# Patient Record
Sex: Male | Born: 1950 | Race: White | Hispanic: No | Smoking: Current every day smoker
Health system: Southern US, Community
[De-identification: ages and names within clinical notes are randomized; demographics above are authoritative.]

## PROBLEM LIST (undated history)

## (undated) DIAGNOSIS — E079 Disorder of thyroid, unspecified: Secondary | ICD-10-CM

## (undated) DIAGNOSIS — B182 Chronic viral hepatitis C: Secondary | ICD-10-CM

## (undated) DIAGNOSIS — K760 Fatty (change of) liver, not elsewhere classified: Secondary | ICD-10-CM

## (undated) DIAGNOSIS — F1090 Alcohol use, unspecified, uncomplicated: Secondary | ICD-10-CM

## (undated) DIAGNOSIS — I739 Peripheral vascular disease, unspecified: Secondary | ICD-10-CM

## (undated) DIAGNOSIS — K219 Gastro-esophageal reflux disease without esophagitis: Secondary | ICD-10-CM

## (undated) DIAGNOSIS — I10 Essential (primary) hypertension: Secondary | ICD-10-CM

## (undated) DIAGNOSIS — G629 Polyneuropathy, unspecified: Secondary | ICD-10-CM

## (undated) DIAGNOSIS — R7303 Prediabetes: Secondary | ICD-10-CM

---

## 2008-06-24 ENCOUNTER — Emergency Department (HOSPITAL_COMMUNITY): Admission: EM | Admit: 2008-06-24 | Discharge: 2008-06-24 | Payer: Self-pay | Admitting: Emergency Medicine

## 2008-06-27 ENCOUNTER — Emergency Department (HOSPITAL_COMMUNITY): Admission: EM | Admit: 2008-06-27 | Discharge: 2008-06-27 | Payer: Self-pay | Admitting: Emergency Medicine

## 2009-06-03 ENCOUNTER — Inpatient Hospital Stay (HOSPITAL_COMMUNITY): Admission: EM | Admit: 2009-06-03 | Discharge: 2009-06-04 | Payer: Self-pay | Admitting: Emergency Medicine

## 2009-06-14 ENCOUNTER — Ambulatory Visit (HOSPITAL_COMMUNITY): Admission: RE | Admit: 2009-06-14 | Discharge: 2009-06-14 | Payer: Self-pay | Admitting: Otolaryngology

## 2009-06-15 ENCOUNTER — Inpatient Hospital Stay (HOSPITAL_COMMUNITY): Admission: RE | Admit: 2009-06-15 | Discharge: 2009-06-17 | Payer: Self-pay | Admitting: Otolaryngology

## 2010-04-09 LAB — COMPREHENSIVE METABOLIC PANEL
ALT: 40 U/L (ref 0–53)
AST: 34 U/L (ref 0–37)
Albumin: 3.7 g/dL (ref 3.5–5.2)
Albumin: 3.8 g/dL (ref 3.5–5.2)
Alkaline Phosphatase: 51 U/L (ref 39–117)
BUN: 6 mg/dL (ref 6–23)
BUN: 7 mg/dL (ref 6–23)
CO2: 23 mEq/L (ref 19–32)
CO2: 25 mEq/L (ref 19–32)
Calcium: 8.8 mg/dL (ref 8.4–10.5)
Calcium: 9 mg/dL (ref 8.4–10.5)
Chloride: 110 mEq/L (ref 96–112)
GFR calc Af Amer: >60 mL/min (ref >60)
GFR calc non Af Amer: >60 mL/min (ref >60)
Sodium: 135 mEq/L (ref 135–145)
Sodium: 141 mEq/L (ref 135–145)
Total Bilirubin: 0.4 mg/dL (ref 0.3–1.2)
Total Protein: 6.8 g/dL (ref 6.0–8.3)

## 2010-04-09 LAB — CULTURE, ROUTINE-ABSCESS

## 2010-04-09 LAB — CBC
MCHC: 34.5 g/dL (ref 30.0–36.0)
MCHC: 34.6 g/dL (ref 30.0–36.0)
MCV: 94.8 fL (ref 78.0–100.0)
RDW: 13 % (ref 11.5–15.5)
WBC: 9.3 10*3/uL (ref 4.0–10.5)

## 2010-04-09 LAB — ANAEROBIC CULTURE

## 2010-04-09 LAB — AFB CULTURE WITH SMEAR (NOT AT ARMC)

## 2012-04-22 IMAGING — CT CT MAXILLOFACIAL W/ CM
3 series · 16 of 47 positions shown, 19 images · IV contrast (agent unspecified)
Comparison: CT 06/03/2009.

CLINICAL DATA: Mandible fracture and infection.  Surgery 1 week
ago.

CT MAXILLOFACIAL WITH CONTRAST
TECHNIQUE: Multidetector CT imaging of the maxillofacial
structures was performed with intravenous contrast. Multiplanar CT
image reconstructions were also generated.
Contrast: 100 ml Tmnipaque-QAA IV.

[Series 3: orbit 2.0 h32s · axial · 0.33mm/px · z∈[+990,+1140]mm · 10 of 89 slices shown, 13 images]
[im 7/89  brain]
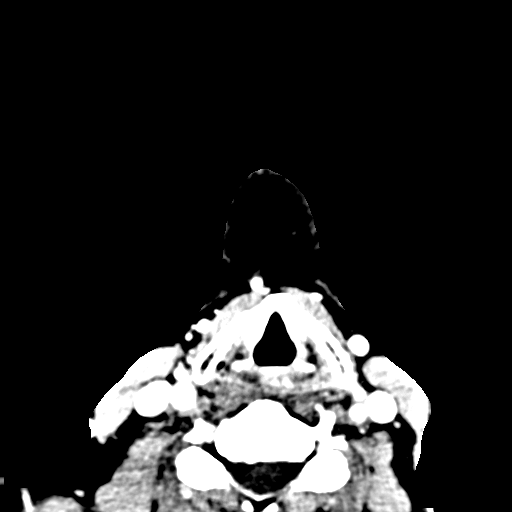
[im 7/89  bone]
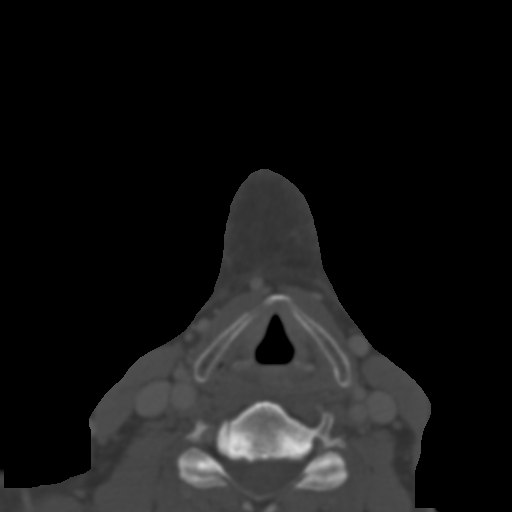
[im 16/89  bone]
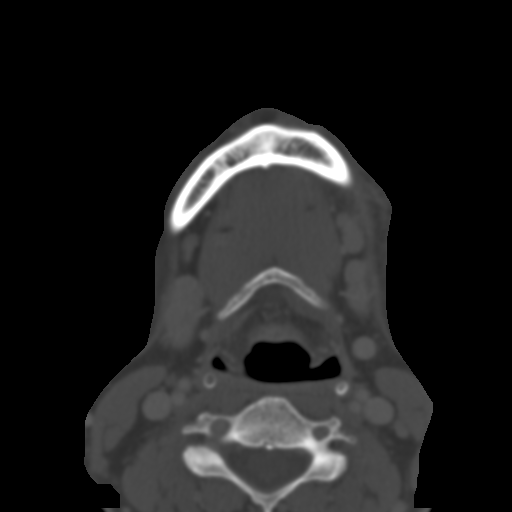
[im 25/89  bone]
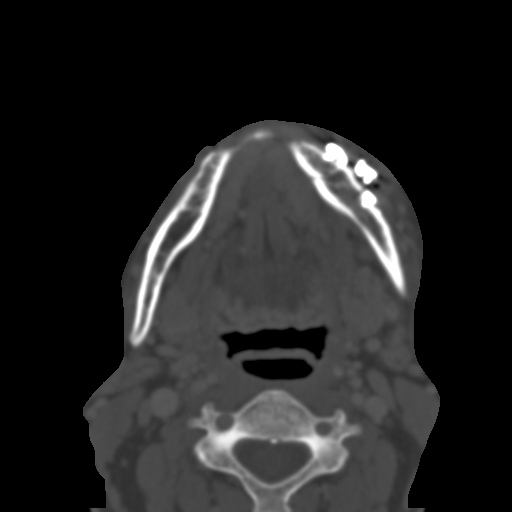
[im 31/89  bone]
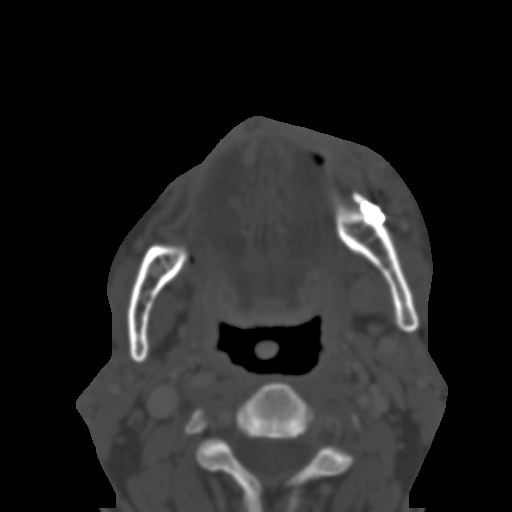
[im 40/89  brain]
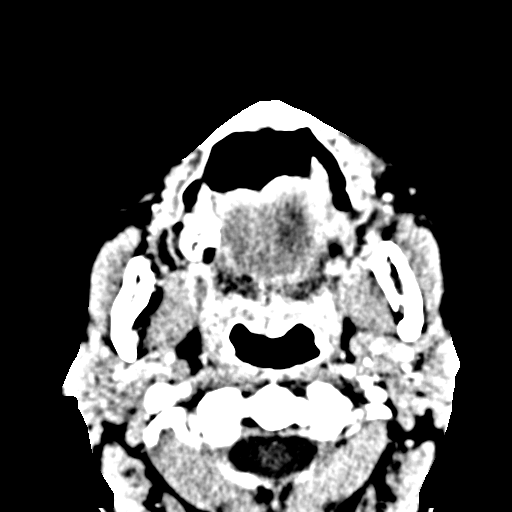
[im 40/89  bone]
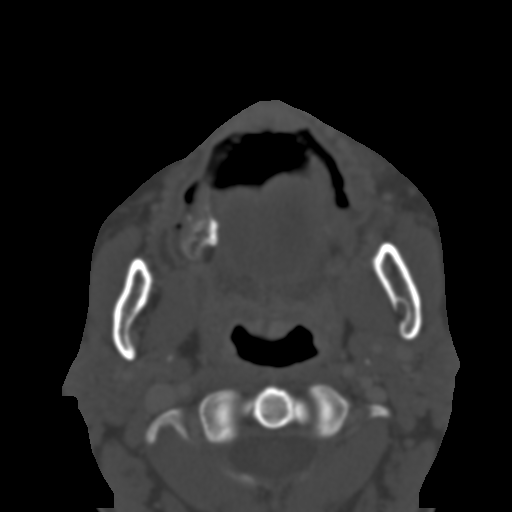
[im 49/89  bone]
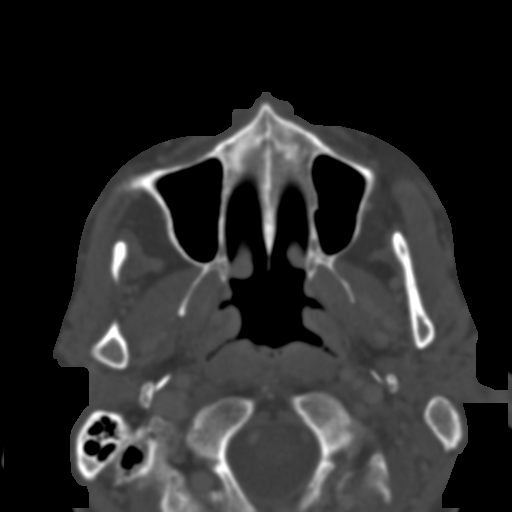
[im 58/89  bone]
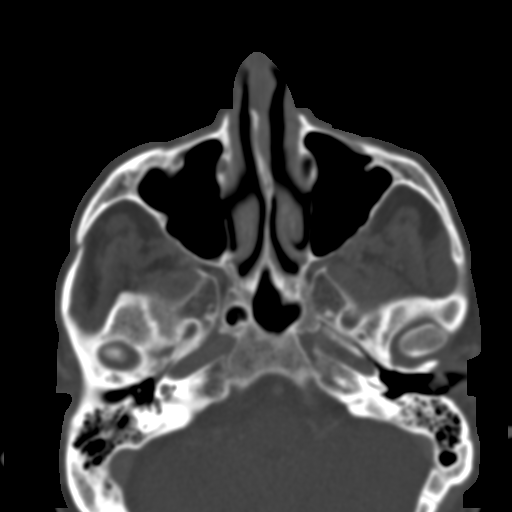
[im 67/89  bone]
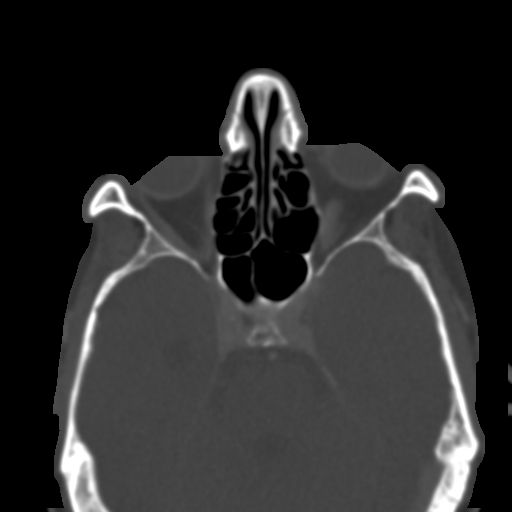
[im 73/89  brain]
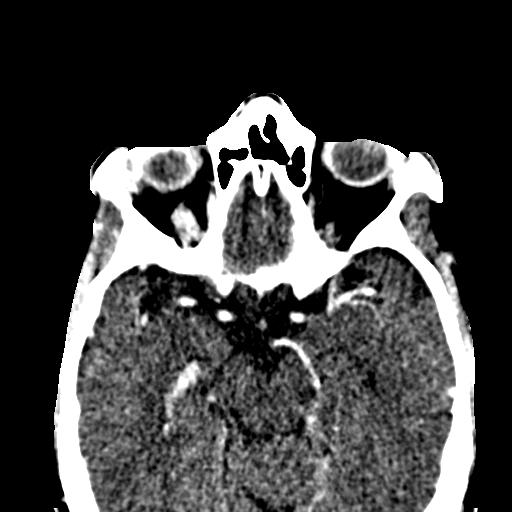
[im 73/89  bone]
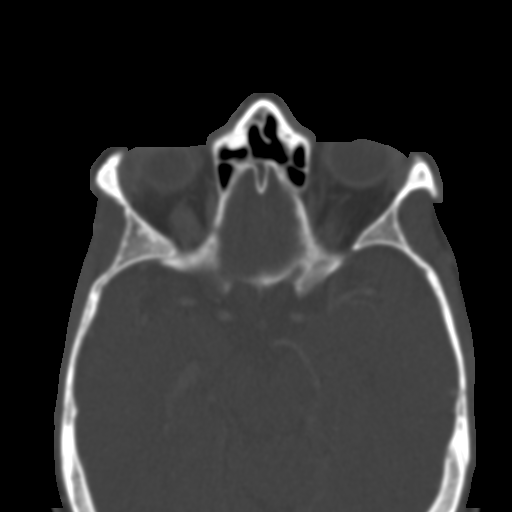
[im 82/89  bone]
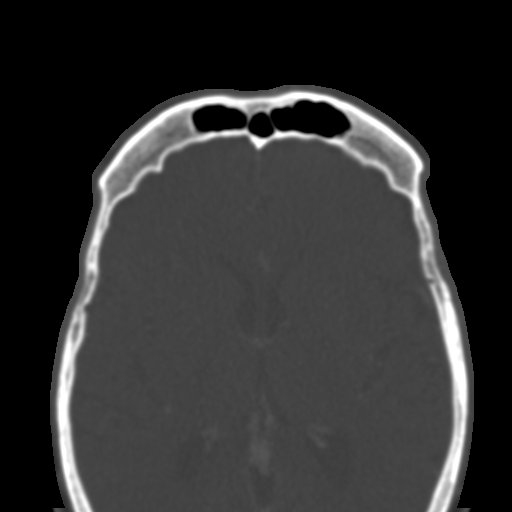

[Series 604: coronal st · coronal · 0.35mm/px · 3 of 70 slices shown]
[im 24/70  bone]
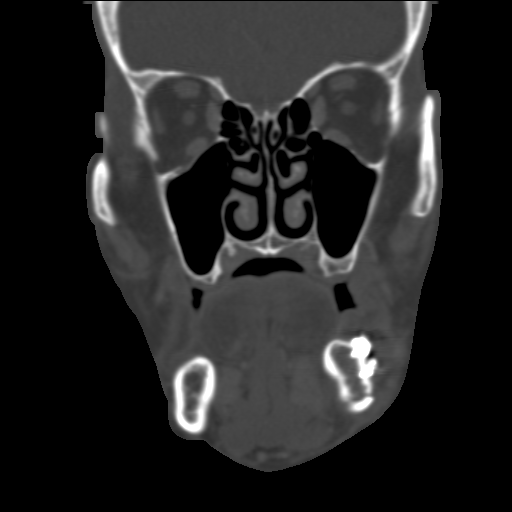
[im 31/70  bone]
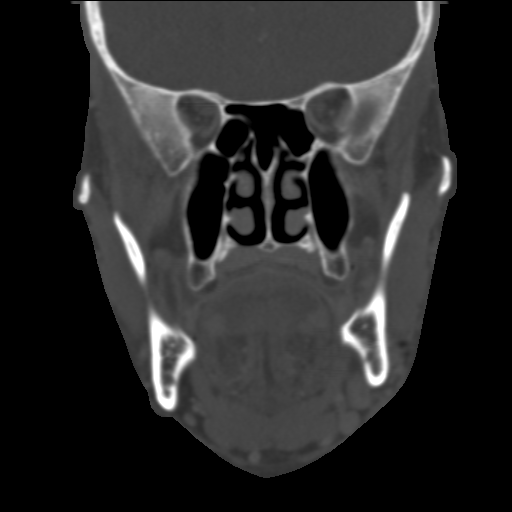
[im 39/70  bone]
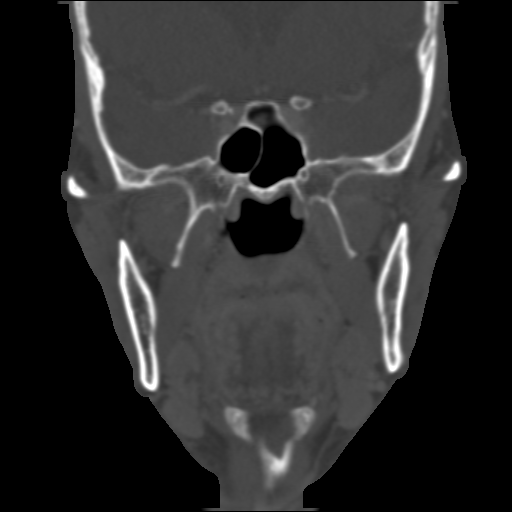

[Series 605: sagittal st · sagittal · 0.35mm/px · 3 of 85 slices shown]
[im 29/85  bone]
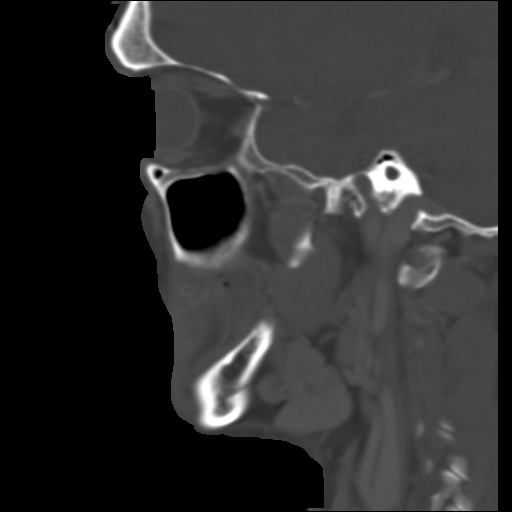
[im 43/85  bone]
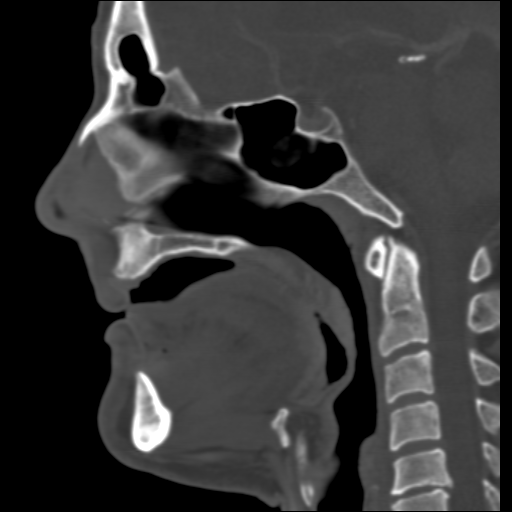
[im 57/85  bone]
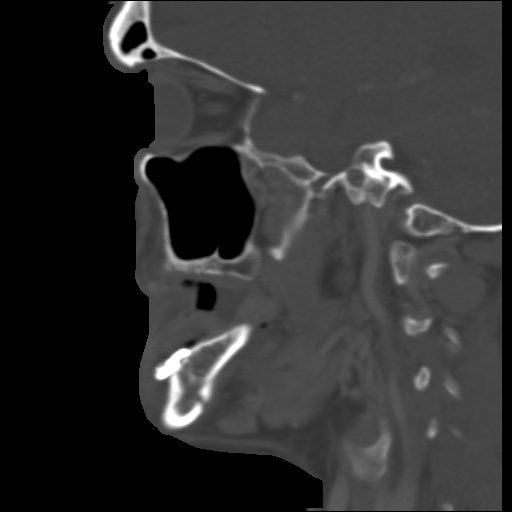

[16 of 47 positions shown; findings below may reference images not displayed]

FINDINGS: There is a fracture of the body of the mandible on the
left.  This is been fixed with two plates and multiple screws.  One
of the upper screws is in the soft tissues.  One of the lower
screws is also in the soft tissues and not within the mandible.
There is soft tissue swelling.  No abscess is present.

There is ill-defined lucency in the left mandible at the level the
fracture.  This was present on the prior study and may be related
to the fracture or possibly infection.  No cortical destruction is
present.

There is a nasal bone fracture, unchanged.  Chronic fracture of the
right zygomatic arch is unchanged.  The sinuses are clear.
IMPRESSION: Fracture of the body of the mandible on the left has been fixed
with two plates and screws.  Two of the screws have backed out of
the mandible.  Fracture alignment is satisfactory.

Hypodensities are present in the left mandible around the fracture.
This was present on the prior study and is likely related to the
fracture.  Alignment is satisfactory.  Underlying infection not
excluded however there is no cortical destruction.

## 2021-01-30 ENCOUNTER — Other Ambulatory Visit: Payer: Self-pay

## 2021-01-30 DIAGNOSIS — I739 Peripheral vascular disease, unspecified: Secondary | ICD-10-CM

## 2021-02-14 NOTE — Progress Notes (Deleted)
Office Note     CC:  RLE claudication  Requesting Provider:  Sharmon Revere, MD  HPI: Henry Wright is a 71 y.o. (04/11/1950) male presenting at the request of .No primary care provider on file. ***  The pt is *** on a statin for cholesterol management.  The pt is *** on a daily aspirin.   Other AC:  *** The pt is *** on medication for hypertension.   The pt is *** diabetic.  Tobacco hx:  ***  No past medical history on file.  *** The histories are not reviewed yet. Please review them in the "History" navigator section and refresh this SmartLink.  Social History   Socioeconomic History   Marital status: Divorced    Spouse name: Not on file   Number of children: Not on file   Years of education: Not on file   Highest education level: Not on file  Occupational History   Not on file  Tobacco Use   Smoking status: Not on file   Smokeless tobacco: Not on file  Substance and Sexual Activity   Alcohol use: Not on file   Drug use: Not on file   Sexual activity: Not on file  Other Topics Concern   Not on file  Social History Narrative   Not on file   Social Determinants of Health   Financial Resource Strain: Not on file  Food Insecurity: Not on file  Transportation Needs: Not on file  Physical Activity: Not on file  Stress: Not on file  Social Connections: Not on file  Intimate Partner Violence: Not on file   ***No family history on file.  No current outpatient medications on file.   No current facility-administered medications for this visit.    Not on File   REVIEW OF SYSTEMS:  *** [X]  denotes positive finding, [ ]  denotes negative finding Cardiac  Comments:  Chest pain or chest pressure:    Shortness of breath upon exertion:    Short of breath when lying flat:    Irregular heart rhythm:        Vascular    Pain in calf, thigh, or hip brought on by ambulation:    Pain in feet at night that wakes you up from your sleep:     Blood clot in your veins:     Leg swelling:         Pulmonary    Oxygen at home:    Productive cough:     Wheezing:         Neurologic    Sudden weakness in arms or legs:     Sudden numbness in arms or legs:     Sudden onset of difficulty speaking or slurred speech:    Temporary loss of vision in one eye:     Problems with dizziness:         Gastrointestinal    Blood in stool:     Vomited blood:         Genitourinary    Burning when urinating:     Blood in urine:        Psychiatric    Major depression:         Hematologic    Bleeding problems:    Problems with blood clotting too easily:        Skin    Rashes or ulcers:        Constitutional    Fever or chills:      PHYSICAL EXAMINATION:  There were no vitals filed for this visit.  General:  WDWN in NAD; vital signs documented above Gait: Not observed HENT: WNL, normocephalic Pulmonary: normal non-labored breathing , without wheezing Cardiac: {Desc; regular/irreg:14544} HR, bruit*** Abdomen: soft, NT, no masses Skin: {With/Without:20273} rashes Vascular Exam/Pulses:  Right Left  Radial {Exam; arterial pulse strength 0-4:30167} {Exam; arterial pulse strength 0-4:30167}  Ulnar {Exam; arterial pulse strength 0-4:30167} {Exam; arterial pulse strength 0-4:30167}  Femoral {Exam; arterial pulse strength 0-4:30167} {Exam; arterial pulse strength 0-4:30167}  Popliteal {Exam; arterial pulse strength 0-4:30167} {Exam; arterial pulse strength 0-4:30167}  DP {Exam; arterial pulse strength 0-4:30167} {Exam; arterial pulse strength 0-4:30167}  PT {Exam; arterial pulse strength 0-4:30167} {Exam; arterial pulse strength 0-4:30167}   Extremities: {With/Without:20273} ischemic changes, {With/Without:20273} Gangrene , {With/Without:20273} cellulitis; {With/Without:20273} open wounds;  Musculoskeletal: no muscle wasting or atrophy  Neurologic: A&O X 3;  No focal weakness or paresthesias are detected Psychiatric:  The pt has {Desc;  normal/abnormal:11317::"Normal"} affect.   Non-Invasive Vascular Imaging:   ***    ASSESSMENT/PLAN: Henry Wright is a 71 y.o. male presenting with ***   ***   Henry John, MD [facility] [phone]

## 2021-02-16 ENCOUNTER — Inpatient Hospital Stay (HOSPITAL_COMMUNITY): Admission: RE | Admit: 2021-02-16 | Payer: Medicare Other | Source: Ambulatory Visit

## 2021-02-16 ENCOUNTER — Encounter: Payer: Medicare Other | Admitting: Vascular Surgery

## 2021-03-30 ENCOUNTER — Encounter: Payer: Medicare Other | Admitting: Surgery

## 2021-03-30 ENCOUNTER — Inpatient Hospital Stay (HOSPITAL_COMMUNITY): Admission: RE | Admit: 2021-03-30 | Payer: Medicare Other | Source: Ambulatory Visit

## 2021-04-24 ENCOUNTER — Other Ambulatory Visit: Payer: Self-pay

## 2021-04-24 DIAGNOSIS — I739 Peripheral vascular disease, unspecified: Secondary | ICD-10-CM

## 2021-05-08 ENCOUNTER — Encounter (HOSPITAL_COMMUNITY): Payer: Medicare Other

## 2021-05-08 ENCOUNTER — Encounter: Payer: Medicare Other | Admitting: Vascular Surgery

## 2022-05-30 ENCOUNTER — Telehealth: Payer: Self-pay

## 2022-05-30 NOTE — Telephone Encounter (Signed)
LVM for patient to call back. AS, CMA 

## 2022-09-02 NOTE — Telephone Encounter (Signed)
 Patient being referred for treatment of HCV. Please schedule next available new patient visit.

## 2022-09-03 NOTE — Telephone Encounter (Signed)
 Calling to schedule NP appt. No answer, no vmail. Mailed unable to contact letter. Advised referring provider.

## 2022-09-11 ENCOUNTER — Encounter: Payer: Self-pay | Admitting: *Deleted

## 2022-09-27 ENCOUNTER — Other Ambulatory Visit: Payer: Self-pay | Admitting: Family Medicine

## 2022-09-27 DIAGNOSIS — I739 Peripheral vascular disease, unspecified: Secondary | ICD-10-CM

## 2022-09-27 DIAGNOSIS — K7460 Unspecified cirrhosis of liver: Secondary | ICD-10-CM

## 2022-10-10 ENCOUNTER — Ambulatory Visit
Admission: RE | Admit: 2022-10-10 | Discharge: 2022-10-10 | Disposition: A | Discharge status: Home / Self Care | Payer: Medicare Other | Source: Ambulatory Visit | Attending: Family Medicine | Admitting: Family Medicine

## 2022-10-10 ENCOUNTER — Other Ambulatory Visit: Payer: Medicare Other

## 2022-10-10 DIAGNOSIS — K7460 Unspecified cirrhosis of liver: Secondary | ICD-10-CM

## 2022-10-11 ENCOUNTER — Ambulatory Visit
Admission: RE | Admit: 2022-10-11 | Discharge: 2022-10-11 | Disposition: A | Discharge status: Home / Self Care | Payer: Medicare Other | Source: Ambulatory Visit | Attending: Family Medicine | Admitting: Family Medicine

## 2022-10-11 ENCOUNTER — Other Ambulatory Visit: Payer: Self-pay | Admitting: Family Medicine

## 2022-10-11 DIAGNOSIS — I739 Peripheral vascular disease, unspecified: Secondary | ICD-10-CM

## 2022-10-25 ENCOUNTER — Ambulatory Visit
Admission: RE | Admit: 2022-10-25 | Discharge: 2022-10-25 | Disposition: A | Discharge status: Home / Self Care | Payer: Medicare Other | Source: Ambulatory Visit | Attending: Family Medicine | Admitting: Family Medicine

## 2022-11-27 NOTE — Telephone Encounter (Signed)
 Mailed ns letter and policy information. Advised referring provider.

## 2022-12-02 NOTE — Telephone Encounter (Signed)
 Patient calling to reschedule NS appt. New appt 03-31-23 1:30 pm. Mailed NP packet. Advised referring provider.

## 2023-03-17 ENCOUNTER — Encounter (INDEPENDENT_AMBULATORY_CARE_PROVIDER_SITE_OTHER): Payer: Self-pay | Admitting: *Deleted

## 2023-03-31 NOTE — Telephone Encounter (Signed)
 Advised referring provider that patient was no show for 2 NP appts. We will not reschedule.

## 2023-09-03 ENCOUNTER — Encounter (HOSPITAL_COMMUNITY): Payer: Self-pay

## 2023-09-03 ENCOUNTER — Emergency Department (HOSPITAL_COMMUNITY)
Admission: EM | Admit: 2023-09-03 | Discharge: 2023-09-03 | Disposition: A | Discharge status: Home / Self Care | Source: Ambulatory Visit | Attending: Emergency Medicine | Admitting: Emergency Medicine

## 2023-09-03 ENCOUNTER — Other Ambulatory Visit: Payer: Self-pay

## 2023-09-03 DIAGNOSIS — R252 Cramp and spasm: Secondary | ICD-10-CM | POA: Diagnosis not present

## 2023-09-03 DIAGNOSIS — F17200 Nicotine dependence, unspecified, uncomplicated: Secondary | ICD-10-CM | POA: Diagnosis not present

## 2023-09-03 DIAGNOSIS — Z79899 Other long term (current) drug therapy: Secondary | ICD-10-CM | POA: Insufficient documentation

## 2023-09-03 DIAGNOSIS — M79662 Pain in left lower leg: Secondary | ICD-10-CM | POA: Diagnosis present

## 2023-09-03 DIAGNOSIS — I10 Essential (primary) hypertension: Secondary | ICD-10-CM | POA: Insufficient documentation

## 2023-09-03 HISTORY — DX: Peripheral vascular disease, unspecified: I73.9

## 2023-09-03 HISTORY — DX: Alcohol use, unspecified, uncomplicated: F10.90

## 2023-09-03 HISTORY — DX: Fatty (change of) liver, not elsewhere classified: K76.0

## 2023-09-03 HISTORY — DX: Essential (primary) hypertension: I10

## 2023-09-03 HISTORY — DX: Gastro-esophageal reflux disease without esophagitis: K21.9

## 2023-09-03 HISTORY — DX: Prediabetes: R73.03

## 2023-09-03 HISTORY — DX: Polyneuropathy, unspecified: G62.9

## 2023-09-03 HISTORY — DX: Disorder of thyroid, unspecified: E07.9

## 2023-09-03 HISTORY — DX: Chronic viral hepatitis C: B18.2

## 2023-09-03 LAB — BASIC METABOLIC PANEL WITH GFR
Anion gap: 13 (ref 5–15)
BUN: 11 mg/dL (ref 8–23)
CO2: 19 mmol/L — ABNORMAL LOW (ref 22–32)
Calcium: 9.3 mg/dL (ref 8.9–10.3)
Chloride: 96 mmol/L — ABNORMAL LOW (ref 98–111)
Creatinine, Ser: 0.74 mg/dL (ref 0.61–1.24)
GFR, Estimated: >60 mL/min (ref >60)
Glucose, Bld: 101 mg/dL — ABNORMAL HIGH (ref 70–99)
Potassium: 4.4 mmol/L (ref 3.5–5.1)
Sodium: 128 mmol/L — ABNORMAL LOW (ref 135–145)

## 2023-09-03 LAB — CBC WITH DIFFERENTIAL/PLATELET
Abs Immature Granulocytes: 0.04 K/uL (ref 0.00–0.07)
Basophils Absolute: 0.1 K/uL (ref 0.0–0.1)
Basophils Relative: 1 %
Eosinophils Absolute: 0.1 K/uL (ref 0.0–0.5)
Eosinophils Relative: 1 %
HCT: 48.3 % (ref 39.0–52.0)
Hemoglobin: 16.3 g/dL (ref 13.0–17.0)
Immature Granulocytes: 1 %
Lymphocytes Relative: 26 %
Lymphs Abs: 1.7 K/uL (ref 0.7–4.0)
MCH: 30.3 pg (ref 26.0–34.0)
MCHC: 33.7 g/dL (ref 30.0–36.0)
MCV: 89.8 fL (ref 80.0–100.0)
Monocytes Absolute: 0.7 K/uL (ref 0.1–1.0)
Monocytes Relative: 10 %
Neutro Abs: 4.2 K/uL (ref 1.7–7.7)
Neutrophils Relative %: 61 %
Platelets: 248 K/uL (ref 150–400)
RBC: 5.38 MIL/uL (ref 4.22–5.81)
RDW: 14.5 % (ref 11.5–15.5)
WBC: 6.7 K/uL (ref 4.0–10.5)
nRBC: 0 % (ref 0.0–0.2)

## 2023-09-03 MED ORDER — GABAPENTIN 300 MG PO CAPS: 300.0000 mg | ORAL_CAPSULE | Freq: Three times a day (TID) | ORAL | 0 refills | Status: AC

## 2023-09-03 MED ORDER — LOSARTAN POTASSIUM 25 MG PO TABS
100.0000 mg | ORAL_TABLET | Freq: Once | ORAL | Status: AC
  Administered 2023-09-03 (×2): 100 mg via ORAL
  Filled 2023-09-03: qty 4

## 2023-09-03 NOTE — ED Triage Notes (Signed)
 Pt c/o bilateral foot to knee numbness ongoing x 2 years that has gotten worse, rates pain 7/10. Pt is ambulatory. states his PCP has referred him to a vein specialist and a liver specialist, pt states his PCP thinks he has a blood clot in his R leg but pt states he believes he has neuropathy. Pt also endorses hx of hepatitis C. Pt also endorses Hx of HTN and states he does take medication for it but did not take it today, BP in triage 233/101 (139). Denies CP and denies shob

## 2023-09-03 NOTE — Discharge Instructions (Addendum)
 We evaluated you for your leg pain.  Your examination is reassuring.  Your sodium level is slightly low, please try to drink some Gatorade or electrolyte beverages over the next few days.  We suspect your leg pain is most likely due to poor circulation.  We would recommend following up with a vascular surgeon.  You can call Dr. Pearline for follow-up.  Please return if you have any new or worsening symptoms such as color change in your legs, swelling to your legs, lightheadedness or dizziness, severe pain in your legs, or any other new symptoms.

## 2023-09-03 NOTE — ED Provider Notes (Signed)
 [place] EMERGENCY DEPARTMENT AT [facility] Provider Note  CSN: [id number] Arrival date & time: 09/03/23 1035  Chief Complaint(s) Leg Pain  HPI Henry Wright is a 73 y.o. male history of hepatitis C, GERD, hypertension, peripheral artery disease presented to the emergency department with intermittent numbness in legs.  Patient reports that he has been having intermittent numbness, cramping, discomfort in lower legs bilaterally for around 2 years.  Reports that it has been worse over past 3 months and decided to come and get checked out today.  Also did not take his blood pressure medicine today.  Denies any fevers or chills.  Denies any back pain, urinary incontinence, numbness or tingling in the groin.  Denies any leg swelling, recent travel or surgery.  No rash.   Past Medical History Past Medical History:  Diagnosis Date   Alcohol use    Chronic hepatitis C without hepatic coma (HCC)    GERD (gastroesophageal reflux disease)    Hypertension    Metabolic dysfunction-associated steatotic liver disease (MASLD)    PAD (peripheral artery disease) (HCC)    Peripheral neuropathy    Prediabetes    Thyroid disease    There are no active problems to display for this patient.  Home Medication(s) Prior to Admission medications   Medication Sig Start Date End Date Taking? Authorizing Provider  atorvastatin (LIPITOR) 10 MG tablet Take 10 mg by mouth daily. 08/15/23  Yes [provider]  gabapentin  (NEURONTIN ) 300 MG capsule Take 1 capsule (300 mg total) by mouth 3 (three) times daily. 09/03/23  Yes Francesca Elsie CROME, MD  levothyroxine (SYNTHROID) 25 MCG tablet Take 25 mcg by mouth daily. 08/17/23  Yes [provider]  losartan  (COZAAR ) 100 MG tablet Take 100 mg by mouth daily. 08/15/23  Yes [provider]  omeprazole (PRILOSEC) 10 MG capsule Take 10 mg by mouth daily.   Yes [provider]                                                                                                                                     Past Surgical History  Family History No family history on file.  Social History   Allergies Patient has no allergy information on record.  Review of Systems Review of Systems  All other systems reviewed and are negative.   Physical Exam Vital Signs  I have reviewed the triage vital signs BP (!) 187/85   Pulse 65   Temp (!) 97 F (36.1 C) (Axillary)   Resp 16   Ht 5' 7.5 (1.715 m)   Wt 74.8 kg   SpO2 94%   BMI 25.46 kg/m  Physical Exam Vitals and nursing note reviewed.  Constitutional:      General: He is not in acute distress.    Appearance: Normal appearance.  HENT:     Mouth/Throat:     Mouth: Mucous membranes are moist.  Eyes:  Conjunctiva/sclera: Conjunctivae normal.  Cardiovascular:     Rate and Rhythm: Normal rate and regular rhythm.  Pulmonary:     Effort: Pulmonary effort is normal. No respiratory distress.     Breath sounds: Normal breath sounds.  Abdominal:     General: Abdomen is flat.     Palpations: Abdomen is soft.     Tenderness: There is no abdominal tenderness.  Musculoskeletal:     Comments: 1+ bilateral DP pulse, confirmed by bedside ultrasound.  Delayed capillary refill.  Feet warm.  No calf tenderness or edema.  Diminished sensation bilaterally to light touch  Skin:    General: Skin is warm and dry.     Capillary Refill: Capillary refill takes less than 2 seconds.  Neurological:     Mental Status: He is alert and oriented to person, place, and time. Mental status is at baseline.  Psychiatric:        Mood and Affect: Mood normal.        Behavior: Behavior normal.     ED Results and Treatments Labs (all labs ordered are listed, but only abnormal results are displayed) Labs Reviewed  BASIC METABOLIC PANEL WITH GFR - Abnormal; Notable for the following components:      Result Value   Sodium 128 (*)    Chloride 96 (*)    CO2 19 (*)    Glucose, Bld 101  (*)    All other components within normal limits  CBC WITH DIFFERENTIAL/PLATELET                                                                                                                          Radiology No results found.  Pertinent labs & imaging results that were available during my care of the patient were reviewed by me and considered in my medical decision making (see MDM for details).  Medications Ordered in ED Medications  losartan  (COZAAR ) tablet 100 mg (100 mg Oral Given 09/03/23 1128)                                                                                                                                     Procedures Procedures  (including critical care time)  Medical Decision Making / ED Course   MDM:  73 year old presenting to the emergency department with years of leg cramping, numbness, tingling.  Given diminished pulses, delayed capillary refill, history of smoking,  documented history of peripheral artery disease suspect vascular disease as primary cause of patient's symptoms.  Differential also includes peripheral neuropathy from alcohol use or other process.  Considered other cause such as central cause, no other concerning symptoms to suggest spinal process, no other concerning symptoms to suggest brain process and symptoms are bilaterally.  No clinical findings concerning for DVT.  Given the patient does have evidence of intact circulation although diminished, I feel he is stable for discharge and outpatient follow-up.  Notably, patient did not take his pressure medication and his blood pressure was very elevated.  After we gave him his regular blood pressure medicine it improved.  He was asymptomatic from this.      Additional history obtained: -Additional history obtained from spouse -External records from outside source obtained and reviewed including: Chart review including previous notes, labs, imaging, consultation notes including prior notes     Lab Tests: -I ordered, reviewed, and interpreted labs.   The pertinent results include:   Labs Reviewed  BASIC METABOLIC PANEL WITH GFR - Abnormal; Notable for the following components:      Result Value   Sodium 128 (*)    Chloride 96 (*)    CO2 19 (*)    Glucose, Bld 101 (*)    All other components within normal limits  CBC WITH DIFFERENTIAL/PLATELET    Notable for mild hyponatremia, discussed with pt  ing. I agree with the radiologist interpretation   Medicines ordered and prescription drug management: Meds ordered this encounter  Medications   losartan  (COZAAR ) tablet 100 mg   gabapentin  (NEURONTIN ) 300 MG capsule    Sig: Take 1 capsule (300 mg total) by mouth 3 (three) times daily.    Dispense:  21 capsule    Refill:  0    -I have reviewed the patients home medicines and have made adjustments as needed   Social Determinants of Health:  Diagnosis or treatment significantly limited by social determinants of health: current smoker and alcohol use   Reevaluation: After the interventions noted above, I reevaluated the patient and found that their symptoms have improved  Co morbidities that complicate the patient evaluation  Past Medical History:  Diagnosis Date   Alcohol use    Chronic hepatitis C without hepatic coma (HCC)    GERD (gastroesophageal reflux disease)    Hypertension    Metabolic dysfunction-associated steatotic liver disease (MASLD)    PAD (peripheral artery disease) (HCC)    Peripheral neuropathy    Prediabetes    Thyroid disease       Dispostion: Disposition decision including need for hospitalization was considered, and patient discharged from emergency department.    Final Clinical Impression(s) / ED Diagnoses Final diagnoses:  Leg cramping     This chart was dictated using voice recognition software.  Despite best efforts to proofread,  errors can occur which can change the documentation meaning.    Francesca Elsie CROME,  MD 09/03/23 1339

## 2023-09-23 ENCOUNTER — Other Ambulatory Visit (HOSPITAL_COMMUNITY): Payer: Self-pay | Admitting: Emergency Medicine

## 2023-09-23 DIAGNOSIS — I739 Peripheral vascular disease, unspecified: Secondary | ICD-10-CM

## 2023-09-24 ENCOUNTER — Ambulatory Visit (HOSPITAL_COMMUNITY)
Admission: RE | Admit: 2023-09-24 | Discharge: 2023-09-24 | Disposition: A | Discharge status: Home / Self Care | Source: Ambulatory Visit | Attending: Vascular Surgery | Admitting: Vascular Surgery

## 2023-09-24 ENCOUNTER — Encounter: Payer: Self-pay | Admitting: Vascular Surgery

## 2023-09-24 ENCOUNTER — Ambulatory Visit (INDEPENDENT_AMBULATORY_CARE_PROVIDER_SITE_OTHER): Admitting: Vascular Surgery

## 2023-09-24 VITALS — BP 198/107 | HR 68 | Temp 98.4°F | Resp 20 | Ht 67.5 in | Wt 165.0 lb

## 2023-09-24 DIAGNOSIS — I739 Peripheral vascular disease, unspecified: Secondary | ICD-10-CM | POA: Insufficient documentation

## 2023-09-24 DIAGNOSIS — I70223 Atherosclerosis of native arteries of extremities with rest pain, bilateral legs: Secondary | ICD-10-CM | POA: Insufficient documentation

## 2023-09-24 LAB — VAS US ABI WITH/WO TBI: Left ABI: 0.89

## 2023-09-24 NOTE — Progress Notes (Signed)
 Patient ID: Henry Wright, male   DOB: 07-19-1950, 73 y.o.   MRN: [id number]  Reason for Consult: New Patient (Initial Visit)   Referred by Henry Leni Edyth DELENA, MD  Subjective:     HPI:  Henry Wright is a 73 y.o. male with history of hypertension and family history of coronary artery disease.  He does not have coronary artery disease himself.  He has a long history of bilateral lower extremity claudication with short distance walking.  He now has rest pain bilaterally causing him to walk in the middle the night to relieve the pain.  He denies tissue loss or ulceration.  He takes no antiplatelet medications but he is on Lipitor.  He continues to smoke daily.  Past Medical History:  Diagnosis Date   Alcohol use    Chronic hepatitis C without hepatic coma (HCC)    GERD (gastroesophageal reflux disease)    Hypertension    Metabolic dysfunction-associated steatotic liver disease (MASLD)    PAD (peripheral artery disease) (HCC)    Peripheral neuropathy    Prediabetes    Thyroid disease    History reviewed. No pertinent family history. History reviewed. No pertinent surgical history.  Short Social History:  Social History   Tobacco Use   Smoking status: Every Day    Current packs/day: 1.00    Types: Cigarettes   Smokeless tobacco: Never  Substance Use Topics   Alcohol use: Not Currently    No Known Allergies  Current Outpatient Medications  Medication Sig Dispense Refill   atorvastatin (LIPITOR) 10 MG tablet Take 10 mg by mouth daily.     gabapentin  (NEURONTIN ) 300 MG capsule Take 1 capsule (300 mg total) by mouth 3 (three) times daily. 21 capsule 0   levothyroxine (SYNTHROID) 25 MCG tablet Take 25 mcg by mouth daily.     losartan  (COZAAR ) 100 MG tablet Take 100 mg by mouth daily.     omeprazole (PRILOSEC) 10 MG capsule Take 10 mg by mouth daily.     No current facility-administered medications for this visit.    Review of Systems  Constitutional:  Constitutional  negative. HENT: HENT negative.  Eyes: Eyes negative.  Cardiovascular: Positive for claudication.  GI: Gastrointestinal negative.  Musculoskeletal: Positive for leg pain.  Neurological: Neurological negative. Hematologic: Hematologic/lymphatic negative.  Psychiatric: Psychiatric negative.        Objective:  Objective   Vitals:   09/24/23 1350  BP: (!) 198/107  Pulse: 68  Resp: 20  Temp: 98.4 F (36.9 C)  TempSrc: Temporal  SpO2: 93%  Weight: 165 lb (74.8 kg)  Height: 5' 7.5 (1.715 m)   Body mass index is 25.46 kg/m.  Physical Exam HENT:     Head: Normocephalic.     Mouth/Throat:     Mouth: Mucous membranes are moist.  Eyes:     Pupils: Pupils are equal, round, and reactive to light.  Neck:     Vascular: No carotid bruit.  Cardiovascular:     Rate and Rhythm: Normal rate.     Pulses:          Femoral pulses are 3+ on the right side and 2+ on the left side.      Popliteal pulses are 0 on the right side and 0 on the left side.  Pulmonary:     Effort: Pulmonary effort is normal.  Abdominal:     General: Abdomen is flat.     Palpations: Abdomen is soft.  Skin:    Capillary  Refill: Capillary refill takes more than 3 seconds.  Neurological:     General: No focal deficit present.     Mental Status: He is alert.     Data: ABI Findings:  +---------+------------------+-----+----------+--------+  Right   Rt Pressure (mmHg)IndexWaveform  Comment   +---------+------------------+-----+----------+--------+  Brachial 203                                        +---------+------------------+-----+----------+--------+  PTA     254               1.18 monophasic          +---------+------------------+-----+----------+--------+  PERO    254               1.18 monophasic          +---------+------------------+-----+----------+--------+  DP      254               1.18 monophasic          +---------+------------------+-----+----------+--------+   Great Toe23                0.11 Abnormal            +---------+------------------+-----+----------+--------+   +---------+------------------+-----+----------+-------+  Left    Lt Pressure (mmHg)IndexWaveform  Comment  +---------+------------------+-----+----------+-------+  Brachial 216                                       +---------+------------------+-----+----------+-------+  PTA                            absent             +---------+------------------+-----+----------+-------+  PERO    171               0.84 monophasic         +---------+------------------+-----+----------+-------+  DP      192               0.89 monophasic         +---------+------------------+-----+----------+-------+  Great Toe85                0.39 Abnormal           +---------+------------------+-----+----------+-------+   +-------+-----------+-----------+------------+------------+  ABI/TBIToday's ABIToday's TBIPrevious ABIPrevious TBI  +-------+-----------+-----------+------------+------------+  Right N/C        .11                                  +-------+-----------+-----------+------------+------------+  Left  .89        .39                                  +-------+-----------+-----------+------------+------------+        Summary:  Right: Resting right ankle-brachial index indicates noncompressible right  lower extremity arteries. The right toe-brachial index is abnormal.    Left: Resting left ankle-brachial index indicates mild left lower  extremity arterial disease. The left toe-brachial index is abnormal.         Assessment/Plan:     73 year old male with atherosclerosis native arteries with bilateral lower extremity rest pain.  Symptoms are classic for  rest pain and short distance claudication but without tissue loss or ulceration.  He has palpable femoral pulses no pulses below with delayed capillary refill particularly on the  right.  We discussed the need for smoking cessation and addition of aspirin to his medication regimen.  I have recommended continued walking and protection of his feet.  I will have him follow-up in a couple months with bilateral lower extremity duplex for further evaluation and to discuss possible aortogram.  If his symptoms worsen in the interim he can be scheduled for aortogram we will focus on the right lower extremity where the toe pressure is severely reduced.  All questions were answered he demonstrates good understanding in the presence of his sister-in-law.     Penne Lonni Colorado MD [facility]

## 2023-09-25 ENCOUNTER — Other Ambulatory Visit: Payer: Self-pay | Admitting: *Deleted

## 2023-09-25 DIAGNOSIS — I70223 Atherosclerosis of native arteries of extremities with rest pain, bilateral legs: Secondary | ICD-10-CM

## 2023-10-07 ENCOUNTER — Other Ambulatory Visit: Payer: Self-pay | Admitting: Family Medicine

## 2023-10-07 DIAGNOSIS — K7460 Unspecified cirrhosis of liver: Secondary | ICD-10-CM

## 2023-11-21 ENCOUNTER — Other Ambulatory Visit: Payer: Self-pay

## 2023-11-21 DIAGNOSIS — I70223 Atherosclerosis of native arteries of extremities with rest pain, bilateral legs: Secondary | ICD-10-CM

## 2023-12-31 ENCOUNTER — Ambulatory Visit: Admitting: Vascular Surgery

## 2023-12-31 ENCOUNTER — Ambulatory Visit (HOSPITAL_COMMUNITY): Admission: RE | Admit: 2023-12-31 | Discharge: 2023-12-31 | Attending: Surgery | Admitting: Surgery

## 2023-12-31 ENCOUNTER — Encounter: Payer: Self-pay | Admitting: Vascular Surgery

## 2023-12-31 ENCOUNTER — Other Ambulatory Visit: Payer: Self-pay | Admitting: Vascular Surgery

## 2023-12-31 VITALS — BP 146/85 | HR 58 | Temp 97.9°F

## 2023-12-31 DIAGNOSIS — I70223 Atherosclerosis of native arteries of extremities with rest pain, bilateral legs: Secondary | ICD-10-CM

## 2023-12-31 LAB — VAS US ABI WITH/WO TBI
Left ABI: 0.74
Right ABI: 0.64

## 2023-12-31 MED ORDER — NICOTINE 7 MG/24HR TD PT24: 7.0000 mg | MEDICATED_PATCH | Freq: Every day | TRANSDERMAL | Status: AC

## 2023-12-31 NOTE — H&P (View-Only) (Signed)
 Patient ID: Henry Wright, male   DOB: 10/10/1950, 73 y.o.   MRN: [id number]  Reason for Consult: Follow-up   Referred by Maree Leni Edyth DELENA, MD  Subjective:     HPI:  Henry Wright is a 73 y.o. male has history current everyday smoking about 1 pack/day and hypertension.  He has attempted to quit smoking for many years unsuccessfully.  He has right greater than left lower extremity short distance claudication with occasional pain at night in the right lower extremity.  He is taking statin and aspirin.  He denies tissue loss or ulceration.  Past Medical History:  Diagnosis Date   Alcohol use    Chronic hepatitis C without hepatic coma (HCC)    GERD (gastroesophageal reflux disease)    Hypertension    Metabolic dysfunction-associated steatotic liver disease (MASLD)    PAD (peripheral artery disease)    Peripheral neuropathy    Prediabetes    Thyroid disease    History reviewed. No pertinent family history.  History reviewed. No pertinent surgical history.  Short Social History:  Social History   Tobacco Use   Smoking status: Every Day    Current packs/day: 1.00    Average packs/day: 1 pack/day for 62.0 years (62.0 ttl pk-yrs)    Types: Cigarettes    Start date: 12/30/1961   Smokeless tobacco: Never  Substance Use Topics   Alcohol use: Not Currently    No Known Allergies  Current Outpatient Medications  Medication Sig Dispense Refill   atorvastatin (LIPITOR) 10 MG tablet Take 10 mg by mouth daily.     gabapentin  (NEURONTIN ) 300 MG capsule Take 1 capsule (300 mg total) by mouth 3 (three) times daily. 21 capsule 0   levothyroxine (SYNTHROID) 25 MCG tablet Take 25 mcg by mouth daily.     losartan  (COZAAR ) 100 MG tablet Take 100 mg by mouth daily.     omeprazole (PRILOSEC) 10 MG capsule Take 10 mg by mouth daily.     No current facility-administered medications for this visit.    Review of Systems  Constitutional:  Constitutional negative. HENT: HENT negative.   Eyes: Eyes negative.  Respiratory: Respiratory negative.  Cardiovascular: Cardiovascular negative.  GI: Gastrointestinal negative.  Musculoskeletal: Positive for gait problem and leg pain.  Psychiatric: Psychiatric negative.        Objective:  Objective   Vitals:   12/31/23 1516  BP: (!) 146/85  Pulse: (!) 58  Temp: 97.9 F (36.6 C)  SpO2: 94%   There is no height or weight on file to calculate BMI.  Physical Exam HENT:     Head: Normocephalic.  Eyes:     Pupils: Pupils are equal, round, and reactive to light.  Cardiovascular:     Rate and Rhythm: Normal rate.     Pulses:          Femoral pulses are 2+ on the right side and 2+ on the left side.      Popliteal pulses are 0 on the right side and 0 on the left side.  Pulmonary:     Effort: Pulmonary effort is normal.  Abdominal:     General: Abdomen is flat.  Musculoskeletal:        General: Normal range of motion.     Right lower leg: No edema.     Left lower leg: No edema.  Skin:    General: Skin is warm.     Capillary Refill: Capillary refill takes 2 to 3 seconds.  Neurological:  General: No focal deficit present.     Mental Status: He is alert.  Psychiatric:        Mood and Affect: Mood normal.     Data: ABI Findings:  +---------+------------------+-----+-------------------+--------+  Right   Rt Pressure (mmHg)IndexWaveform           Comment   +---------+------------------+-----+-------------------+--------+  Brachial 152                                                 +---------+------------------+-----+-------------------+--------+  PTA     96                0.63 dampened monophasic          +---------+------------------+-----+-------------------+--------+  DP      98                0.64 monophasic                   +---------+------------------+-----+-------------------+--------+  Great Toe0                 0.00 Absent                        +---------+------------------+-----+-------------------+--------+   +---------+------------------+-----+-------------------+-------+  Left    Lt Pressure (mmHg)IndexWaveform           Comment  +---------+------------------+-----+-------------------+-------+  Brachial 150                                                +---------+------------------+-----+-------------------+-------+  PTA     113               0.74 dampened monophasic         +---------+------------------+-----+-------------------+-------+  DP      99                0.65 monophasic                  +---------+------------------+-----+-------------------+-------+  Great Toe0                 0.00 Absent                      +---------+------------------+-----+-------------------+-------+   +-------+-----------+-----------+------------+------------+  ABI/TBIToday's ABIToday's TBIPrevious ABIPrevious TBI  +-------+-----------+-----------+------------+------------+  Right 0.64       0          [place]          .11           +-------+-----------+-----------+------------+------------+  Left  0.74       0          0.89        0.39          +-------+-----------+-----------+------------+------------+       Summary:  Right: Resting right ankle-brachial index indicates moderate right lower  extremity arterial disease. The right toe-brachial index is abnormal.    Left: Resting left ankle-brachial index indicates moderate left lower  extremity arterial disease. The left toe-brachial index is abnormal.   RIGHT      PSV cm/sRatioStenosisWaveform  Comments                +-----------+--------+-----+--------+----------+----------------------+  CFA Mid  104                  triphasic                         +-----------+--------+-----+--------+----------+----------------------+  CFA Distal 96                   biphasic                           +-----------+--------+-----+--------+----------+----------------------+  DFA       79                   biphasic                          +-----------+--------+-----+--------+----------+----------------------+  SFA Prox   21                   biphasic                          +-----------+--------+-----+--------+----------+----------------------+  SFA Mid    14                   monophasic                        +-----------+--------+-----+--------+----------+----------------------+  SFA Distal 7                    monophasicFlow reversed           +-----------+--------+-----+--------+----------+----------------------+  POP Prox   33                   monophasicReconstitution of flow  +-----------+--------+-----+--------+----------+----------------------+  POP Mid    31                   monophasic                        +-----------+--------+-----+--------+----------+----------------------+  ATA Distal 24                   monophasic                        +-----------+--------+-----+--------+----------+----------------------+  PTA Distal 13                   monophasic                        +-----------+--------+-----+--------+----------+----------------------+  PERO Distal14                   monophasic                        +-----------+--------+-----+--------+----------+----------------------+        +-----------+--------+-----+---------------+----------+--------------------  ----+  LEFT      PSV cm/sRatioStenosis       Waveform  Comments                   +-----------+--------+-----+---------------+----------+--------------------  ----+  EIA Distal 161                         biphasic                             +-----------+--------+-----+---------------+----------+--------------------  ----+  CFA Prox   190                         biphasic                              +-----------+--------+-----+---------------+----------+--------------------  ----+  CFA Mid    152                         biphasic                             +-----------+--------+-----+---------------+----------+--------------------  ----+  CFA Distal 156                         biphasic                             +-----------+--------+-----+---------------+----------+--------------------  ----+  DFA       0            occluded                                            +-----------+--------+-----+---------------+----------+--------------------  ----+  SFA Prox   252          30-49% stenosisbiphasic                             +-----------+--------+-----+---------------+----------+--------------------  ----+  SFA Mid    130                         biphasic                             +-----------+--------+-----+---------------+----------+--------------------  ----+  SFA Distal 64                          biphasic                             +-----------+--------+-----+---------------+----------+--------------------  ----+  POP Prox   89                          biphasic                             +-----------+--------+-----+---------------+----------+--------------------  ----+  POP Distal 70                          biphasic                             +-----------+--------+-----+---------------+----------+--------------------  ----+  ATA Distal 26                          biphasic                             +-----------+--------+-----+---------------+----------+--------------------  ----+  PTA Prox   24                          monophasic                           +-----------+--------+-----+---------------+----------+--------------------  ----+  PTA Mid    0            occluded                                             +-----------+--------+-----+---------------+----------+--------------------  ----+  PTA Distal 16                          biphasic  Flow reversal;                                                              reconstitution seen  at                                                     ankle                      +-----------+--------+-----+---------------+----------+--------------------  ----+  PERO Distal59                          biphasic                             +-----------+--------+-----+---------------+----------+--------------------  ----+      Summary:  Right: Total occlusion noted in the superficial femoral artery.   Left: Total occlusion noted in the deep femoral artery. 30-49% stenosis  noted in the superficial femoral artery. Total occlusion noted in the  posterior tibial artery.      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  We have discussed continued medical management with the addition of Plavix to aspirin and statin and the need for urgent smoking cessation.  I have sent low-dose nicotine patch to his pharmacy in [place].  Will plan for angiography from a left common femoral approach on Monday in the near future.  We discussed the risk benefits alternatives of the procedure as well as need for possible surgical intervention.  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 The patient is on best medical therapy for peripheral arterial disease. The patient has been counseled about the risks of tobacco use in atherosclerotic disease. The patient has been counseled to abstain from any tobacco use. An aortogram with bilateral lower extremity runoff angiography and Right lower extremity intervention and is indicated to better evaluate the patient's  lower extremity circulation because of the limb threatening nature of the patient's diagnosis. Based on the patient's  clinical exam and non-invasive data, we anticipate an endovascular intervention in the femoropopliteal vessels. Stenting would be favored because of the improved primary patency of these interventions as compared to plain balloon angioplasty.      Penne Lonni Colorado MD [facility]

## 2023-12-31 NOTE — Progress Notes (Addendum)
 Patient ID: Henry Wright, male   DOB: 10/10/1950, 73 y.o.   MRN: [id number]  Reason for Consult: Follow-up   Referred by Maree Leni Edyth DELENA, MD  Subjective:     HPI:  Henry Wright is a 73 y.o. male has history current everyday smoking about 1 pack/day and hypertension.  He has attempted to quit smoking for many years unsuccessfully.  He has right greater than left lower extremity short distance claudication with occasional pain at night in the right lower extremity.  He is taking statin and aspirin.  He denies tissue loss or ulceration.  Past Medical History:  Diagnosis Date   Alcohol use    Chronic hepatitis C without hepatic coma (HCC)    GERD (gastroesophageal reflux disease)    Hypertension    Metabolic dysfunction-associated steatotic liver disease (MASLD)    PAD (peripheral artery disease)    Peripheral neuropathy    Prediabetes    Thyroid disease    History reviewed. No pertinent family history.  History reviewed. No pertinent surgical history.  Short Social History:  Social History   Tobacco Use   Smoking status: Every Day    Current packs/day: 1.00    Average packs/day: 1 pack/day for 62.0 years (62.0 ttl pk-yrs)    Types: Cigarettes    Start date: 12/30/1961   Smokeless tobacco: Never  Substance Use Topics   Alcohol use: Not Currently    No Known Allergies  Current Outpatient Medications  Medication Sig Dispense Refill   atorvastatin (LIPITOR) 10 MG tablet Take 10 mg by mouth daily.     gabapentin  (NEURONTIN ) 300 MG capsule Take 1 capsule (300 mg total) by mouth 3 (three) times daily. 21 capsule 0   levothyroxine (SYNTHROID) 25 MCG tablet Take 25 mcg by mouth daily.     losartan  (COZAAR ) 100 MG tablet Take 100 mg by mouth daily.     omeprazole (PRILOSEC) 10 MG capsule Take 10 mg by mouth daily.     No current facility-administered medications for this visit.    Review of Systems  Constitutional:  Constitutional negative. HENT: HENT negative.   Eyes: Eyes negative.  Respiratory: Respiratory negative.  Cardiovascular: Cardiovascular negative.  GI: Gastrointestinal negative.  Musculoskeletal: Positive for gait problem and leg pain.  Psychiatric: Psychiatric negative.        Objective:  Objective   Vitals:   12/31/23 1516  BP: (!) 146/85  Pulse: (!) 58  Temp: 97.9 F (36.6 C)  SpO2: 94%   There is no height or weight on file to calculate BMI.  Physical Exam HENT:     Head: Normocephalic.  Eyes:     Pupils: Pupils are equal, round, and reactive to light.  Cardiovascular:     Rate and Rhythm: Normal rate.     Pulses:          Femoral pulses are 2+ on the right side and 2+ on the left side.      Popliteal pulses are 0 on the right side and 0 on the left side.  Pulmonary:     Effort: Pulmonary effort is normal.  Abdominal:     General: Abdomen is flat.  Musculoskeletal:        General: Normal range of motion.     Right lower leg: No edema.     Left lower leg: No edema.  Skin:    General: Skin is warm.     Capillary Refill: Capillary refill takes 2 to 3 seconds.  Neurological:  General: No focal deficit present.     Mental Status: He is alert.  Psychiatric:        Mood and Affect: Mood normal.     Data: ABI Findings:  +---------+------------------+-----+-------------------+--------+  Right   Rt Pressure (mmHg)IndexWaveform           Comment   +---------+------------------+-----+-------------------+--------+  Brachial 152                                                 +---------+------------------+-----+-------------------+--------+  PTA     96                0.63 dampened monophasic          +---------+------------------+-----+-------------------+--------+  DP      98                0.64 monophasic                   +---------+------------------+-----+-------------------+--------+  Great Toe0                 0.00 Absent                        +---------+------------------+-----+-------------------+--------+   +---------+------------------+-----+-------------------+-------+  Left    Lt Pressure (mmHg)IndexWaveform           Comment  +---------+------------------+-----+-------------------+-------+  Brachial 150                                                +---------+------------------+-----+-------------------+-------+  PTA     113               0.74 dampened monophasic         +---------+------------------+-----+-------------------+-------+  DP      99                0.65 monophasic                  +---------+------------------+-----+-------------------+-------+  Great Toe0                 0.00 Absent                      +---------+------------------+-----+-------------------+-------+   +-------+-----------+-----------+------------+------------+  ABI/TBIToday's ABIToday's TBIPrevious ABIPrevious TBI  +-------+-----------+-----------+------------+------------+  Right 0.64       0          [place]          .11           +-------+-----------+-----------+------------+------------+  Left  0.74       0          0.89        0.39          +-------+-----------+-----------+------------+------------+       Summary:  Right: Resting right ankle-brachial index indicates moderate right lower  extremity arterial disease. The right toe-brachial index is abnormal.    Left: Resting left ankle-brachial index indicates moderate left lower  extremity arterial disease. The left toe-brachial index is abnormal.   RIGHT      PSV cm/sRatioStenosisWaveform  Comments                +-----------+--------+-----+--------+----------+----------------------+  CFA Mid  104                  triphasic                         +-----------+--------+-----+--------+----------+----------------------+  CFA Distal 96                   biphasic                           +-----------+--------+-----+--------+----------+----------------------+  DFA       79                   biphasic                          +-----------+--------+-----+--------+----------+----------------------+  SFA Prox   21                   biphasic                          +-----------+--------+-----+--------+----------+----------------------+  SFA Mid    14                   monophasic                        +-----------+--------+-----+--------+----------+----------------------+  SFA Distal 7                    monophasicFlow reversed           +-----------+--------+-----+--------+----------+----------------------+  POP Prox   33                   monophasicReconstitution of flow  +-----------+--------+-----+--------+----------+----------------------+  POP Mid    31                   monophasic                        +-----------+--------+-----+--------+----------+----------------------+  ATA Distal 24                   monophasic                        +-----------+--------+-----+--------+----------+----------------------+  PTA Distal 13                   monophasic                        +-----------+--------+-----+--------+----------+----------------------+  PERO Distal14                   monophasic                        +-----------+--------+-----+--------+----------+----------------------+        +-----------+--------+-----+---------------+----------+--------------------  ----+  LEFT      PSV cm/sRatioStenosis       Waveform  Comments                   +-----------+--------+-----+---------------+----------+--------------------  ----+  EIA Distal 161                         biphasic                             +-----------+--------+-----+---------------+----------+--------------------  ----+  CFA Prox   190                         biphasic                              +-----------+--------+-----+---------------+----------+--------------------  ----+  CFA Mid    152                         biphasic                             +-----------+--------+-----+---------------+----------+--------------------  ----+  CFA Distal 156                         biphasic                             +-----------+--------+-----+---------------+----------+--------------------  ----+  DFA       0            occluded                                            +-----------+--------+-----+---------------+----------+--------------------  ----+  SFA Prox   252          30-49% stenosisbiphasic                             +-----------+--------+-----+---------------+----------+--------------------  ----+  SFA Mid    130                         biphasic                             +-----------+--------+-----+---------------+----------+--------------------  ----+  SFA Distal 64                          biphasic                             +-----------+--------+-----+---------------+----------+--------------------  ----+  POP Prox   89                          biphasic                             +-----------+--------+-----+---------------+----------+--------------------  ----+  POP Distal 70                          biphasic                             +-----------+--------+-----+---------------+----------+--------------------  ----+  ATA Distal 26                          biphasic                             +-----------+--------+-----+---------------+----------+--------------------  ----+  PTA Prox   24                          monophasic                           +-----------+--------+-----+---------------+----------+--------------------  ----+  PTA Mid    0            occluded                                             +-----------+--------+-----+---------------+----------+--------------------  ----+  PTA Distal 16                          biphasic  Flow reversal;                                                              reconstitution seen  at                                                     ankle                      +-----------+--------+-----+---------------+----------+--------------------  ----+  PERO Distal59                          biphasic                             +-----------+--------+-----+---------------+----------+--------------------  ----+      Summary:  Right: Total occlusion noted in the superficial femoral artery.   Left: Total occlusion noted in the deep femoral artery. 30-49% stenosis  noted in the superficial femoral artery. Total occlusion noted in the  posterior tibial artery.      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  We have discussed continued medical management with the addition of Plavix to aspirin and statin and the need for urgent smoking cessation.  I have sent low-dose nicotine patch to his pharmacy in [place].  Will plan for angiography from a left common femoral approach on Monday in the near future.  We discussed the risk benefits alternatives of the procedure as well as need for possible surgical intervention.  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 The patient is on best medical therapy for peripheral arterial disease. The patient has been counseled about the risks of tobacco use in atherosclerotic disease. The patient has been counseled to abstain from any tobacco use. An aortogram with bilateral lower extremity runoff angiography and Right lower extremity intervention and is indicated to better evaluate the patient's  lower extremity circulation because of the limb threatening nature of the patient's diagnosis. Based on the patient's  clinical exam and non-invasive data, we anticipate an endovascular intervention in the femoropopliteal vessels. Stenting would be favored because of the improved primary patency of these interventions as compared to plain balloon angioplasty.      Penne Lonni Colorado MD [facility]

## 2024-01-01 ENCOUNTER — Other Ambulatory Visit: Payer: Self-pay

## 2024-01-01 DIAGNOSIS — I70223 Atherosclerosis of native arteries of extremities with rest pain, bilateral legs: Secondary | ICD-10-CM

## 2024-01-26 ENCOUNTER — Encounter (HOSPITAL_COMMUNITY)
Admission: RE | Disposition: A | Discharge status: Home / Self Care | Payer: Self-pay | Source: Home / Self Care | Attending: Vascular Surgery

## 2024-01-26 ENCOUNTER — Ambulatory Visit (HOSPITAL_COMMUNITY)
Admission: RE | Admit: 2024-01-26 | Discharge: 2024-01-26 | Disposition: A | Discharge status: Home / Self Care | Attending: Vascular Surgery | Admitting: Vascular Surgery

## 2024-01-26 ENCOUNTER — Other Ambulatory Visit: Payer: Self-pay

## 2024-01-26 ENCOUNTER — Ambulatory Visit (HOSPITAL_BASED_OUTPATIENT_CLINIC_OR_DEPARTMENT_OTHER)

## 2024-01-26 DIAGNOSIS — I70223 Atherosclerosis of native arteries of extremities with rest pain, bilateral legs: Secondary | ICD-10-CM

## 2024-01-26 DIAGNOSIS — F17210 Nicotine dependence, cigarettes, uncomplicated: Secondary | ICD-10-CM | POA: Diagnosis not present

## 2024-01-26 DIAGNOSIS — I70221 Atherosclerosis of native arteries of extremities with rest pain, right leg: Secondary | ICD-10-CM | POA: Insufficient documentation

## 2024-01-26 DIAGNOSIS — I7090 Unspecified atherosclerosis: Secondary | ICD-10-CM | POA: Diagnosis not present

## 2024-01-26 DIAGNOSIS — I70212 Atherosclerosis of native arteries of extremities with intermittent claudication, left leg: Secondary | ICD-10-CM

## 2024-01-26 HISTORY — PX: ABDOMINAL AORTOGRAM W/LOWER EXTREMITY: CATH118223

## 2024-01-26 LAB — POCT I-STAT, CHEM 8
BUN: 10 mg/dL (ref 8–23)
Calcium, Ion: 1.23 mmol/L (ref 1.15–1.40)
Chloride: 97 mmol/L — ABNORMAL LOW (ref 98–111)
Creatinine, Ser: 0.9 mg/dL (ref 0.61–1.24)
Glucose, Bld: 99 mg/dL (ref 70–99)
HCT: 42 % (ref 39.0–52.0)
Hemoglobin: 14.3 g/dL (ref 13.0–17.0)
Potassium: 3.9 mmol/L (ref 3.5–5.1)
Sodium: 135 mmol/L (ref 135–145)
TCO2: 25 mmol/L (ref 22–32)

## 2024-01-26 MED ORDER — OXYCODONE HCL 5 MG PO TABS: 5.0000 mg | ORAL_TABLET | ORAL | Status: DC | PRN

## 2024-01-26 MED ORDER — HEPARIN (PORCINE) IN NACL 1000-0.9 UT/500ML-% IV SOLN
INTRAVENOUS | Status: DC | PRN
  Administered 2024-01-26: 1000 mL

## 2024-01-26 MED ORDER — FENTANYL CITRATE (PF) 100 MCG/2ML IJ SOLN
INTRAMUSCULAR | Status: DC | PRN
  Administered 2024-01-26: 50 ug via INTRAVENOUS

## 2024-01-26 MED ORDER — LABETALOL HCL 5 MG/ML IV SOLN: 10.0000 mg | INTRAVENOUS | Status: DC | PRN

## 2024-01-26 MED ORDER — MIDAZOLAM HCL 2 MG/2ML IJ SOLN
INTRAMUSCULAR | Status: AC
  Filled 2024-01-26: qty 2

## 2024-01-26 MED ORDER — SODIUM CHLORIDE 0.9 % IV SOLN: INTRAVENOUS | Status: DC

## 2024-01-26 MED ORDER — LIDOCAINE HCL (PF) 1 % IJ SOLN
INTRAMUSCULAR | Status: DC | PRN
  Administered 2024-01-26: 15 mL via INTRADERMAL

## 2024-01-26 MED ORDER — MIDAZOLAM HCL (PF) 2 MG/2ML IJ SOLN
INTRAMUSCULAR | Status: DC | PRN
  Administered 2024-01-26: 1 mg via INTRAVENOUS

## 2024-01-26 MED ORDER — LIDOCAINE HCL (PF) 1 % IJ SOLN
INTRAMUSCULAR | Status: AC
  Filled 2024-01-26: qty 30

## 2024-01-26 MED ORDER — ACETAMINOPHEN 325 MG PO TABS: 650.0000 mg | ORAL_TABLET | ORAL | Status: DC | PRN

## 2024-01-26 MED ORDER — IODIXANOL 320 MG/ML IV SOLN
INTRAVENOUS | Status: DC | PRN
  Administered 2024-01-26: 70 mL via INTRA_ARTERIAL

## 2024-01-26 MED ORDER — HYDRALAZINE HCL 20 MG/ML IJ SOLN: 5.0000 mg | INTRAMUSCULAR | Status: DC | PRN

## 2024-01-26 MED ORDER — MORPHINE SULFATE (PF) 2 MG/ML IV SOLN: 2.0000 mg | INTRAVENOUS | Status: DC | PRN

## 2024-01-26 MED ORDER — FENTANYL CITRATE (PF) 100 MCG/2ML IJ SOLN
INTRAMUSCULAR | Status: AC
  Filled 2024-01-26: qty 2

## 2024-01-26 MED ORDER — ONDANSETRON HCL 4 MG/2ML IJ SOLN: 4.0000 mg | Freq: Four times a day (QID) | INTRAMUSCULAR | Status: DC | PRN

## 2024-01-26 NOTE — Discharge Instructions (Signed)
 Femoral Site Care The following information offers guidance on how to care for yourself after your procedure. Your health care provider may also give you more specific instructions. If you have problems or questions, contact your health care provider. What can I expect after the procedure? After the procedure, it is common to have bruising and tenderness at the incision site. This usually fades within 1-2 weeks. Follow these instructions at home: Incision site care  Follow instructions from your health care provider about how to take care of your incision site. Make sure you: Wash your hands with soap and water for at least 20 seconds before and after you change your bandage (dressing). If soap and water are not available, use hand sanitizer. Remove your dressing in 24 hours. Leave stitches (sutures), skin glue, or adhesive strips in place. These skin closures may need to stay in place for 2 weeks or longer. If adhesive strip edges start to loosen and curl up, you may trim the loose edges. Do not remove adhesive strips completely unless your health care provider tells you to do that. Do not take baths, swim, or use a hot tub for at least 1 week. You may shower 24 hours after the procedure or as told by your health care provider. Gently wash the incision site with plain soap and water. Pat the area dry with a clean towel. Do not rub the site. This may cause bleeding. Do not apply powder or lotion to the site. Keep the site clean and dry. Check your femoral site every day for signs of infection. Check for: Redness, swelling, or pain. Fluid or blood. Warmth. Pus or a bad smell. Activity If you were given a sedative during the procedure, it can affect you for several hours. Do not drive or operate machinery until your health care provider says that it is safe. Rest as told by your health care provider. Avoid sitting for a long time without moving. Get up to take short walks every 1-2 hours. This  is important to improve blood flow and breathing. Ask for help if you feel weak or unsteady. Return to your normal activities as told by your health care provider. Ask your health care provider what activities are safe for you and when you can return to work. Avoid activities that take a lot of effort for the first 2-3 days after your procedure, or as long as directed. Do not lift anything that is heavier than 10 lb (4.5 kg), or the limit that you are told, until your health care provider says that it is safe. General instructions Take over-the-counter and prescription medicines only as told by your health care provider. If you will be going home right after the procedure, plan to have a responsible adult care for you for the time you are told. This is important. Keep all follow-up visits. This is important. Contact a health care provider if: You have a fever or chills. You have any of these signs of infection at your incision site: Redness, swelling, or pain. Fluid or blood. Warmth. Pus or a bad smell. Get help right away if: The incision area swells very fast. The incision area is bleeding, and the bleeding does not stop when you hold steady pressure on the area. Your leg or foot becomes pale, cool, tingly, or numb. These symptoms may represent a serious problem that is an emergency. Do not wait to see if the symptoms will go away. Get medical help right away. Call your local emergency  services (911 in the [place] Do not drive yourself to the hospital. Summary After the procedure, it is common to have bruising and tenderness that fade within 1-2 weeks. Check your femoral site every day for signs of infection. Do not lift anything that is heavier than 10 lb (4.5 kg), or the limit that you are told, until your health care provider says that it is safe. Get help right away if the incision area swells very fast, you have bleeding at the incision area that does not stop, or your leg or foot  becomes pale, cool, or numb. This information is not intended to replace advice given to you by your health care provider. Make sure you discuss any questions you have with your health care provider. Document Revised: 09/27/2020 Document Reviewed: 02/27/2020 Elsevier Patient Education  2024 [employer].

## 2024-01-26 NOTE — Progress Notes (Signed)
 Site area: L femoral Site Prior to Removal:  Level 0 Pressure Applied For: 25 minutes Manual:   yes Patient Status During Pull:  stable Post Pull Site:  Level 0 Post Pull Instructions Given:  yes Post Pull Pulses Present: yes- dopler left DP Dressing Applied:  gauze and tege Bedrest begins @ 0850 Comments:

## 2024-01-26 NOTE — Op Note (Signed)
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 Sheree, MD Procedure Performed: 1.  Percutaneous ultrasound-guided cannulation left common femoral artery 2.  Catheter selection of aorta and aortogram with bilateral lower extremity angiography 3.  Catheter selection right common femoral artery 4.  Moderate sedation with fentanyl  and Versed  for 16 minutes   Indications: 74 year old male with history of right lower extremity rest pain and short distance claudication bilaterally with mildly depressed ABIs and toe pressures of 0 bilaterally with notable disease of the left common femoral artery and right superficial femoral artery by duplex.  He is indicated for angiography with possible intervention.  Findings: The aorta appears ectatic and normal size at the aortic bifurcation however the common and external iliac arteries are diseased there is no flow-limiting stenosis.  Hypogastric arteries are very diminutive.  On the right side the common femoral artery appears calcified and likely has flow-limiting stenosis as a Bentson wire would not pass.  The SFA is occluded after the takeoff and reconstitutes above the knee with three-vessel runoff to the foot which is very sluggish and no intervention was undertaken.  On the left side the common femoral artery is heavily diseased the profundus is diminutive the SFA is patent throughout however there are multiple areas of 60% stenosis.  Below the knee he has a diseased anterior tibial artery is a dominant runoff the tibioperoneal trunk is diseased as well on the left and the peroneal is the only runoff after that as the posterior tibial artery is occluded.  Plan will be for vein mapping today of the right lower extremity and follow-up in the office to discuss possible right  common femoral endarterectomy with right femoral to above or below knee popliteal bypass.  He would likely benefit from left common femoral endarterectomy in the future.   Procedure:  The patient was identified in the holding area and taken to room 8.  The patient was then placed supine on the table and prepped and draped in the usual sterile fashion.  A time out was called.  Ultrasound was used to evaluate the left common femoral artery.  This was noted to be heavily calcified with disease leading into both the proximal SFA and the profunda.  The area was anesthetized with 1% lidocaine  and cannulated with a micropuncture needle followed by wire and a sheath.  An ultrasound images saved to the permanent record.  Concomitantly we administered fentanyl  and Versed  as moderate sedation and his vital signs were monitored throughout the case.  We placed a Bentson wire followed by a 5 French sheath and Omni catheter was placed to the level of L1 and aortogram was performed followed by pelvic angiography.  We then crossed the bifurcation using Bentson and Omni and the Bentson was noted to hold up at the proximal common femoral artery.  We then performed right lower extremity angiography and with the above findings the patient would require surgical intervention for revascularization.  Catheter was removed over wire and left lower extremity angiography was performed with retrograde sheath shots from the left.  Given the disease on the left we elected for no closure device and the sheath will be pulled in postoperative holding.  He did tolerate the procedure without immediate complication.  Contrast: 70cc  Rosselyn Martha C. Sheree, MD [facility] Office: [phone] Pager:  [phone] ° ° °"

## 2024-01-26 NOTE — Interval H&P Note (Signed)
 History and Physical Interval Note:  01/26/2024 7:20 AM  Henry Wright  has presented today for surgery, with the diagnosis of atherosclerosis bil lower ext.  The various methods of treatment have been discussed with the patient and family. After consideration of risks, benefits and other options for treatment, the patient has consented to  Procedures: ABDOMINAL AORTOGRAM W/LOWER EXTREMITY (N/A) LOWER EXTREMITY INTERVENTION (N/A) as a surgical intervention.  The patient's history has been reviewed, patient examined, no change in status, stable for surgery.  I have reviewed the patient's chart and labs.  Questions were answered to the patient's satisfaction.     [name]

## 2024-01-26 NOTE — Progress Notes (Addendum)
 Discharge instructions reviewed with patient and sister-in-law at bedside. Denies questions concerns. PT tolerated PO intake. Ambulated in the hallway, was able to void without difficulty. Incision site remains clean dry and intact. No s/s of complications. PT escorted from the unit via wheel chair to personal vehicle.

## 2024-01-26 NOTE — Progress Notes (Signed)
 RLE venous duplex has been completed.   Results can be found under chart review under CV PROC. 01/26/2024 12:23 PM Marvelous [place] RVT, RDMS

## 2024-01-27 ENCOUNTER — Encounter (HOSPITAL_COMMUNITY): Payer: Self-pay | Admitting: Vascular Surgery

## 2024-03-03 ENCOUNTER — Ambulatory Visit: Admitting: Vascular Surgery
